# Patient Record
Sex: Male | Born: 1985 | Race: White | Hispanic: No | Marital: Married | Smoking: Never smoker
Health system: Southern US, Community
[De-identification: ages and names within clinical notes are randomized; demographics above are authoritative.]

---

## 2020-01-08 ENCOUNTER — Emergency Department (HOSPITAL_COMMUNITY)
Admission: EM | Admit: 2020-01-08 | Discharge: 2020-01-08 | Disposition: A | Discharge status: Home / Self Care | Payer: BC Managed Care – PPO | Attending: Emergency Medicine | Admitting: Emergency Medicine

## 2020-01-08 ENCOUNTER — Emergency Department (HOSPITAL_COMMUNITY): Payer: BC Managed Care – PPO

## 2020-01-08 ENCOUNTER — Encounter (HOSPITAL_COMMUNITY): Payer: Self-pay | Admitting: Emergency Medicine

## 2020-01-08 ENCOUNTER — Other Ambulatory Visit: Payer: Self-pay

## 2020-01-08 DIAGNOSIS — R519 Headache, unspecified: Secondary | ICD-10-CM | POA: Insufficient documentation

## 2020-01-08 DIAGNOSIS — Y998 Other external cause status: Secondary | ICD-10-CM | POA: Insufficient documentation

## 2020-01-08 DIAGNOSIS — S90811A Abrasion, right foot, initial encounter: Secondary | ICD-10-CM | POA: Diagnosis not present

## 2020-01-08 DIAGNOSIS — M79673 Pain in unspecified foot: Secondary | ICD-10-CM | POA: Insufficient documentation

## 2020-01-08 DIAGNOSIS — Y9289 Other specified places as the place of occurrence of the external cause: Secondary | ICD-10-CM | POA: Diagnosis not present

## 2020-01-08 DIAGNOSIS — M549 Dorsalgia, unspecified: Secondary | ICD-10-CM | POA: Diagnosis not present

## 2020-01-08 DIAGNOSIS — Y9389 Activity, other specified: Secondary | ICD-10-CM | POA: Insufficient documentation

## 2020-01-08 DIAGNOSIS — M542 Cervicalgia: Secondary | ICD-10-CM | POA: Insufficient documentation

## 2020-01-08 DIAGNOSIS — S99921A Unspecified injury of right foot, initial encounter: Secondary | ICD-10-CM | POA: Diagnosis present
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
## 2020-01-08 NOTE — ED Notes (Signed)
Patient transported to CT 
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
## 2020-01-08 NOTE — ED Notes (Signed)
Patient transported to X-ray 
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
